# Patient Record
Sex: Male | Born: 1985 | Race: White | Hispanic: No | Marital: Married | Smoking: Never smoker
Health system: Southern US, Community
[De-identification: ages and names within clinical notes are randomized; demographics above are authoritative.]

## PROBLEM LIST (undated history)

## (undated) HISTORY — PX: NASAL SINUS SURGERY: SHX719

---

## 2004-11-06 ENCOUNTER — Ambulatory Visit: Payer: Self-pay | Admitting: Otolaryngology

## 2004-12-08 ENCOUNTER — Ambulatory Visit: Payer: Self-pay | Admitting: Otolaryngology

## 2010-07-29 ENCOUNTER — Ambulatory Visit: Payer: Self-pay | Admitting: Internal Medicine

## 2018-09-28 ENCOUNTER — Encounter: Payer: Self-pay | Admitting: Intensive Care

## 2018-09-28 ENCOUNTER — Emergency Department
Admission: EM | Admit: 2018-09-28 | Discharge: 2018-09-28 | Disposition: A | Discharge status: Home / Self Care | Payer: BC Managed Care – PPO | Attending: Emergency Medicine | Admitting: Emergency Medicine

## 2018-09-28 ENCOUNTER — Emergency Department: Payer: BC Managed Care – PPO

## 2018-09-28 ENCOUNTER — Other Ambulatory Visit: Payer: Self-pay

## 2018-09-28 DIAGNOSIS — Z5321 Procedure and treatment not carried out due to patient leaving prior to being seen by health care provider: Secondary | ICD-10-CM | POA: Diagnosis not present

## 2018-09-28 DIAGNOSIS — R55 Syncope and collapse: Secondary | ICD-10-CM | POA: Diagnosis present

## 2018-09-28 LAB — BASIC METABOLIC PANEL
ANION GAP: 10 (ref 5–15)
BUN: 17 mg/dL (ref 6–20)
CO2: 28 mmol/L (ref 22–32)
CREATININE: 1.02 mg/dL (ref 0.61–1.24)
Calcium: 9.1 mg/dL (ref 8.9–10.3)
Chloride: 103 mmol/L (ref 98–111)
GFR calc Af Amer: >60 mL/min (ref >60)
GFR calc non Af Amer: >60 mL/min (ref >60)
Glucose, Bld: 96 mg/dL (ref 70–99)
POTASSIUM: 3.8 mmol/L (ref 3.5–5.1)
SODIUM: 141 mmol/L (ref 135–145)

## 2018-09-28 LAB — CBC WITH DIFFERENTIAL/PLATELET
Abs Immature Granulocytes: 0.02 10*3/uL (ref 0.00–0.07)
Basophils Absolute: 0.1 10*3/uL (ref 0.0–0.1)
Basophils Relative: 1 %
EOS PCT: 2 %
Eosinophils Absolute: 0.1 10*3/uL (ref 0.0–0.5)
HEMATOCRIT: 46.6 % (ref 39.0–52.0)
HEMOGLOBIN: 16.1 g/dL (ref 13.0–17.0)
Immature Granulocytes: 0 %
LYMPHS PCT: 29 %
Lymphs Abs: 1.6 10*3/uL (ref 0.7–4.0)
MCH: 29.5 pg (ref 26.0–34.0)
MCHC: 34.5 g/dL (ref 30.0–36.0)
MCV: 85.3 fL (ref 80.0–100.0)
MONO ABS: 0.5 10*3/uL (ref 0.1–1.0)
MONOS PCT: 9 %
Neutro Abs: 3.3 10*3/uL (ref 1.7–7.7)
Neutrophils Relative %: 59 %
Platelets: 164 10*3/uL (ref 150–400)
RBC: 5.46 MIL/uL (ref 4.22–5.81)
RDW: 12.6 % (ref 11.5–15.5)
WBC: 5.5 10*3/uL (ref 4.0–10.5)
nRBC: 0 % (ref 0.0–0.2)

## 2018-09-28 LAB — TROPONIN I: Troponin I: <0.03 ng/mL (ref <0.03)

## 2018-09-28 LAB — GLUCOSE, CAPILLARY: GLUCOSE-CAPILLARY: 96 mg/dL (ref 70–99)

## 2018-09-28 NOTE — ED Notes (Signed)
Pt declined to wait to be seen at this time, pt is in no distress and plans to call his PCP tomorrow to follow up.  Encouraged pt and wife to return to ED if any other concerns arose. Pt and family agreeable with plan.

## 2018-09-28 NOTE — ED Triage Notes (Signed)
Patient was at work today and started feeling near syncope due to dizziness while helping kids get on bus and had to sit down. Patient reports the room started spinning and he felt weak in legs. Around 2:50pm wife reports slow speech. Denies LOC. Wife present and reports speech is back to normal. Face symmetrical. Bilateral grips strong and equal. Denies weakness in legs at this time. Patients only c/o at this time is pressure in lower part of head.

## 2018-09-29 ENCOUNTER — Telehealth: Payer: Self-pay | Admitting: Emergency Medicine

## 2018-09-29 NOTE — Telephone Encounter (Signed)
Called patient due to lwot to inquire about condition and follow up plans. Left message.   

## 2019-07-03 IMAGING — CT CT HEAD W/O CM
3 series · 15 of 47 positions shown, 18 images · non-contrast
Comparison: MRI 11/06/2004, CT head report 06/12/2002

CLINICAL DATA: Near syncopal

EXAM:
CT HEAD WITHOUT CONTRAST
TECHNIQUE: Contiguous axial images were obtained from the base of the skull
through the vertex without intravenous contrast.

[Series 2: head wo · axial · 0.47mm/px · z∈[+411,+541]mm · 9 of 32 slices shown, 12 images]
[im 3/32  brain]
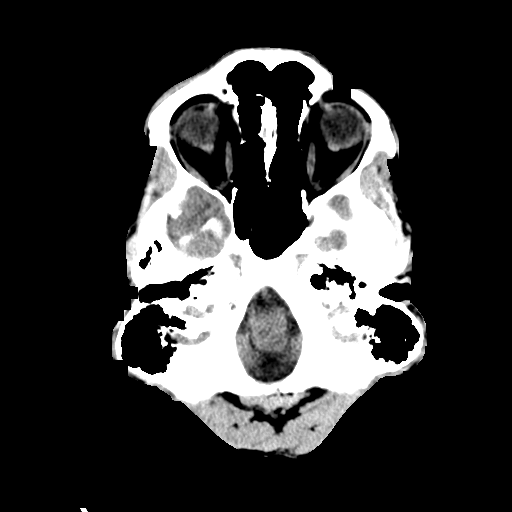
[im 3/32  bone]
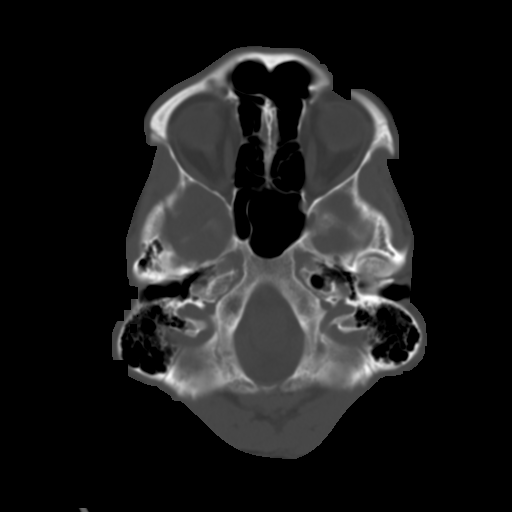
[im 6/32  brain]
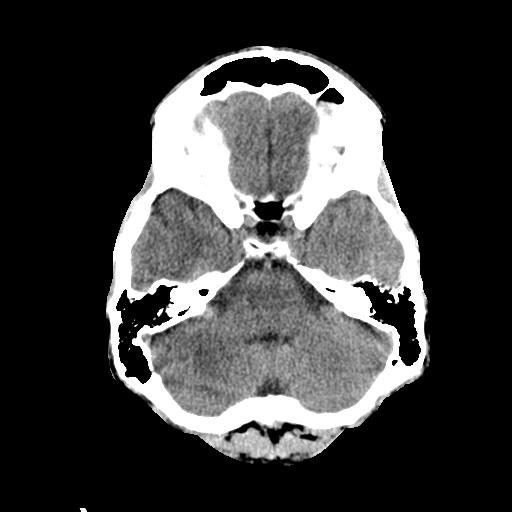
[im 9/32  brain]
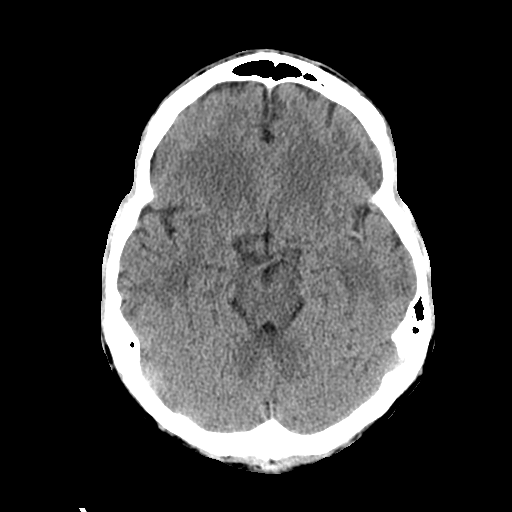
[im 12/32  brain]
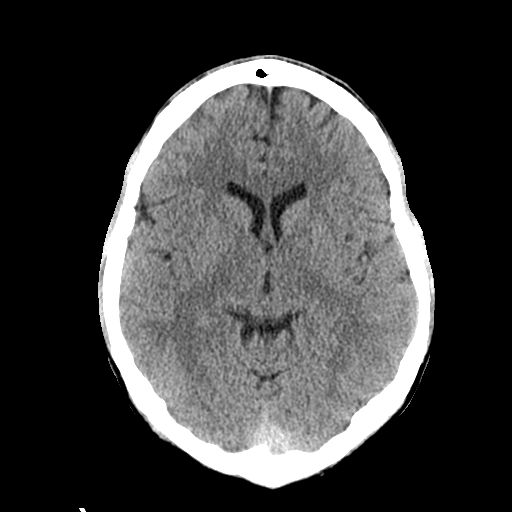
[im 17/32  brain]
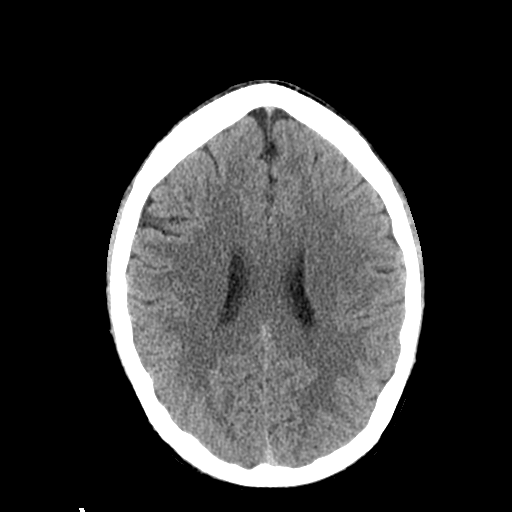
[im 17/32  bone]
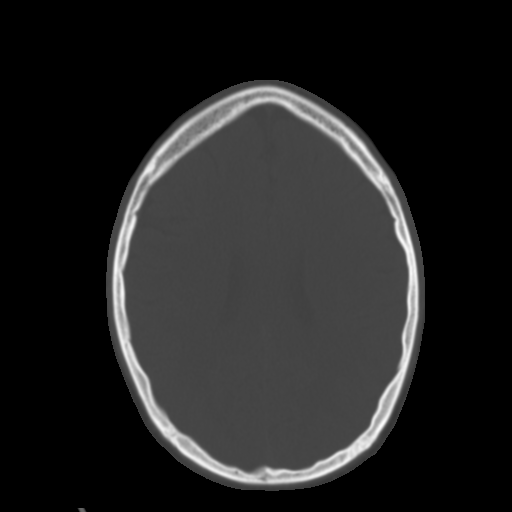
[im 20/32  brain]
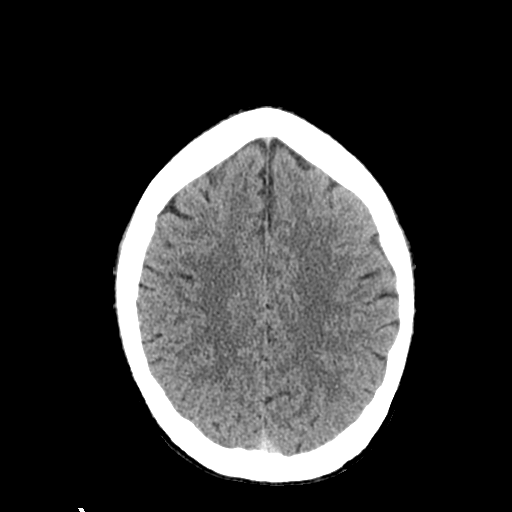
[im 23/32  brain]
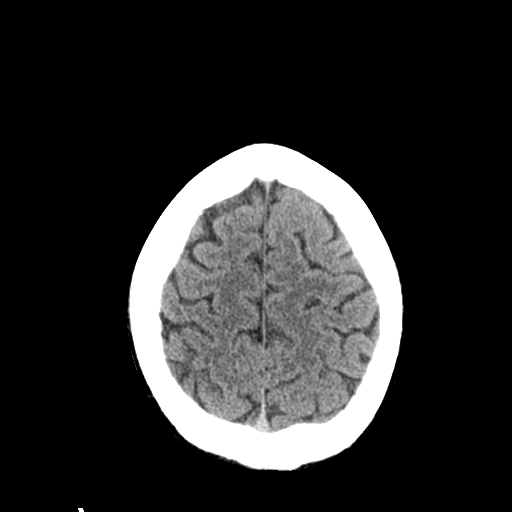
[im 26/32  brain]
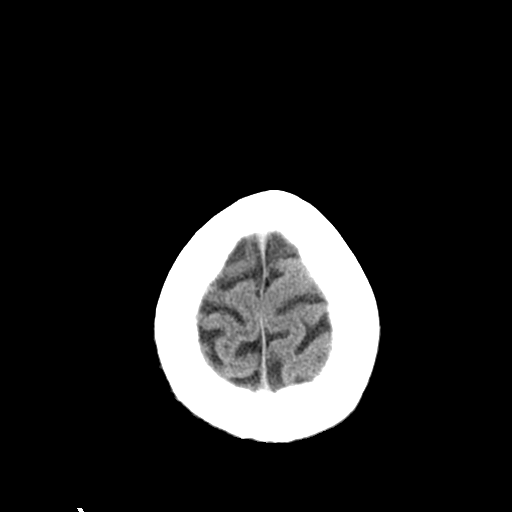
[im 29/32  brain]
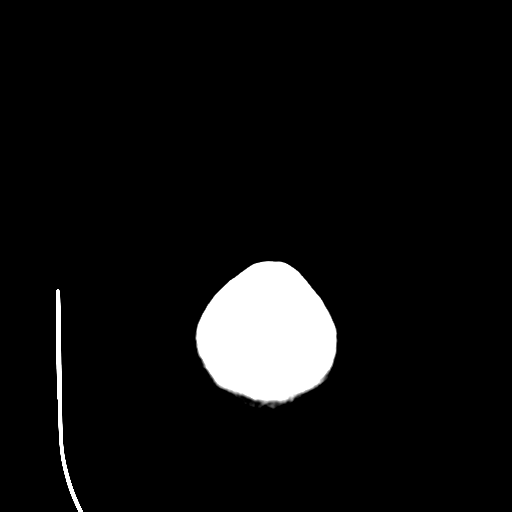
[im 29/32  bone]
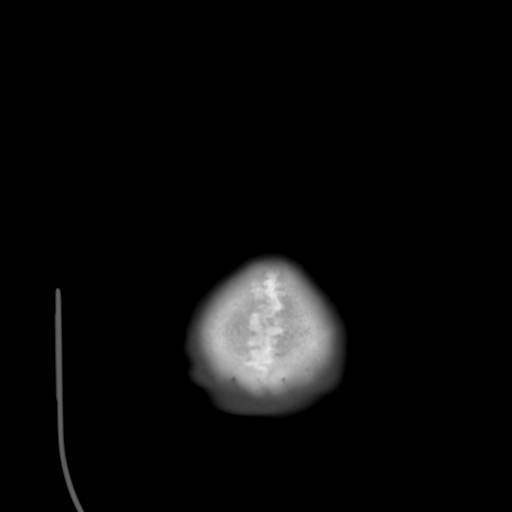

[Series 4: coronal soft tissue · coronal · 0.32mm/px · 3 of 69 slices shown]
[im 23/69  brain]
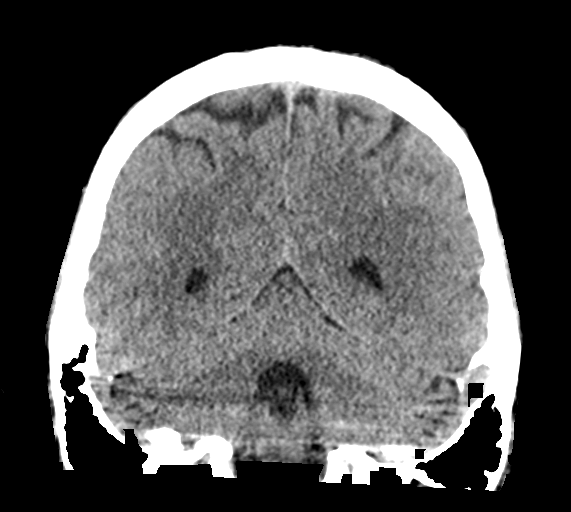
[im 31/69  brain]
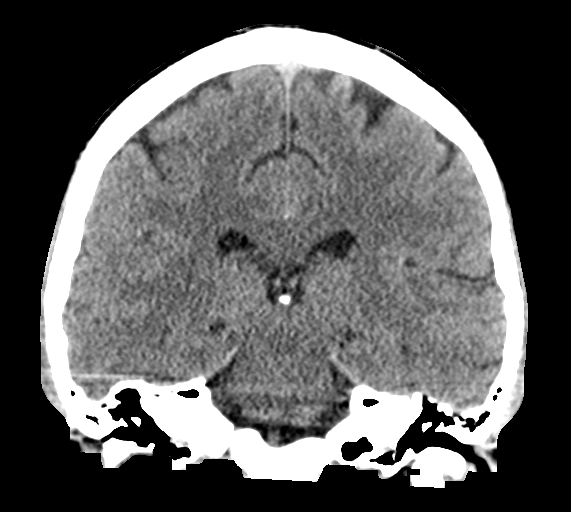
[im 38/69  brain]
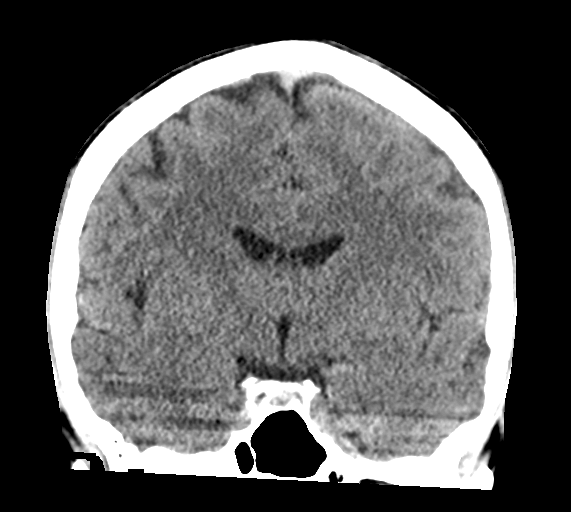

[Series 5: sagittal soft tissue · sagittal · 0.32mm/px · 3 of 54 slices shown]
[im 18/54  brain]
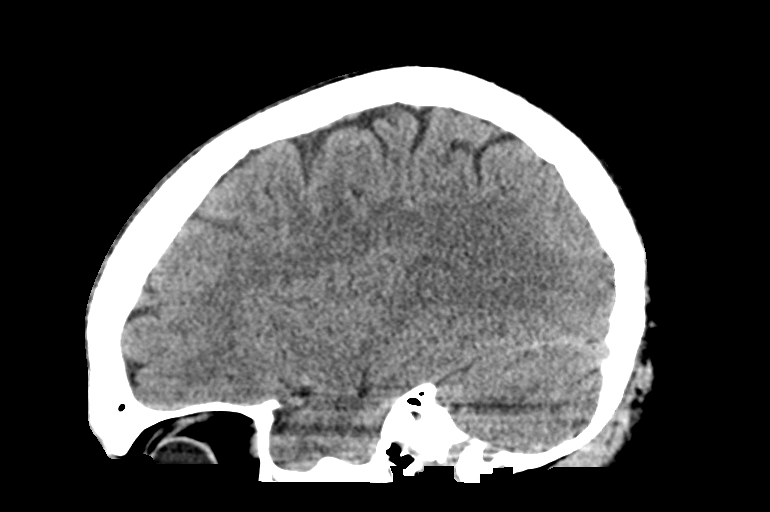
[im 27/54  brain]
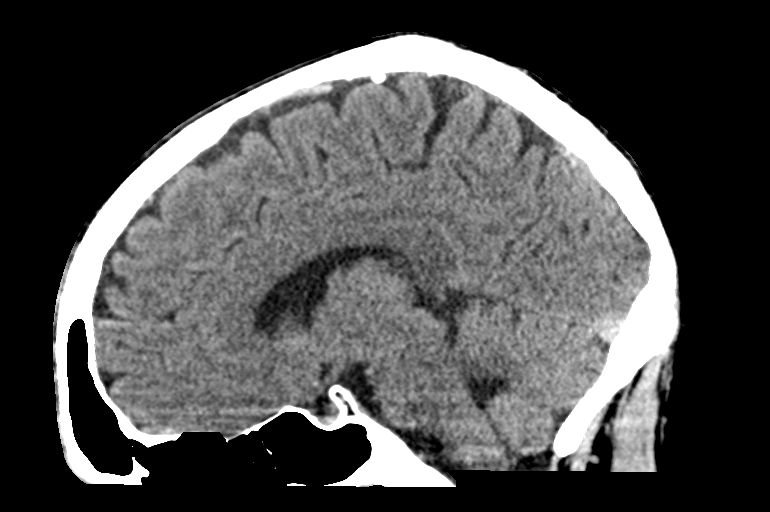
[im 36/54  brain]
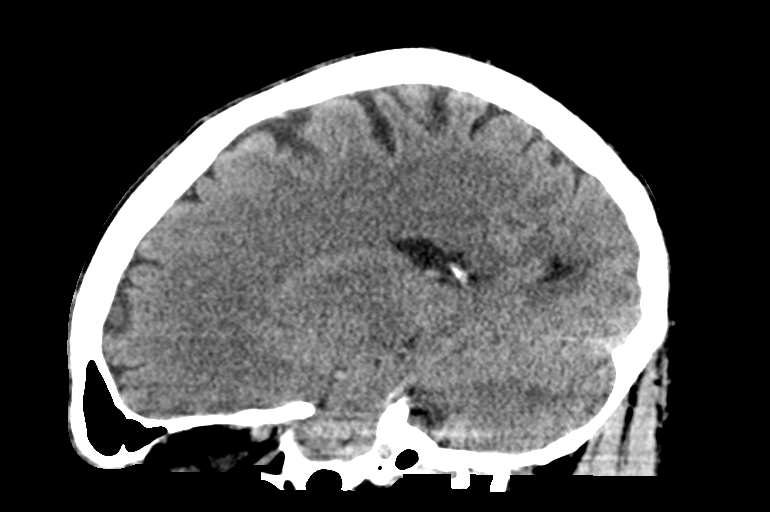

[15 of 47 positions shown; findings below may reference images not displayed]

FINDINGS: BRAIN: The ventricles and sulci are normal. No intraparenchymal
hemorrhage, mass effect nor midline shift. No acute large vascular
territory infarcts. Grey-white matter distinction is maintained. The
basal ganglia are unremarkable. No abnormal extra-axial fluid
collections. Basal cisterns are not effaced and midline. The
brainstem and cerebellar hemispheres are without acute
abnormalities.

VASCULAR: No hyperdense vessel sign or unexpected calcifications.

SKULL/SOFT TISSUES: No skull fracture. No significant soft tissue
swelling.

ORBITS/SINUSES: The included ocular globes and orbital contents are
normal.The mastoid air cells are clear. The included paranasal
sinuses are well-aerated.

OTHER: None.
IMPRESSION: Normal head CT
